# Patient Record
Sex: Male | Born: 1970 | Race: White | Hispanic: No | Marital: Married | Smoking: Former smoker
Health system: Southern US, Community
[De-identification: ages and names within clinical notes are randomized; demographics above are authoritative.]

## PROBLEM LIST (undated history)

## (undated) DIAGNOSIS — Z8719 Personal history of other diseases of the digestive system: Secondary | ICD-10-CM

## (undated) DIAGNOSIS — R51 Headache: Secondary | ICD-10-CM

## (undated) DIAGNOSIS — Z86718 Personal history of other venous thrombosis and embolism: Secondary | ICD-10-CM

## (undated) DIAGNOSIS — K219 Gastro-esophageal reflux disease without esophagitis: Secondary | ICD-10-CM

## (undated) HISTORY — PX: HERNIA REPAIR: SHX51

## (undated) HISTORY — PX: OTHER SURGICAL HISTORY: SHX169

## (undated) HISTORY — PX: APPENDECTOMY: SHX54

---

## 1997-12-31 ENCOUNTER — Ambulatory Visit (HOSPITAL_COMMUNITY): Admission: RE | Admit: 1997-12-31 | Discharge: 1997-12-31 | Payer: Self-pay | Admitting: Gastroenterology

## 1998-01-07 ENCOUNTER — Ambulatory Visit (HOSPITAL_COMMUNITY): Admission: RE | Admit: 1998-01-07 | Discharge: 1998-01-07 | Payer: Self-pay | Admitting: Gastroenterology

## 1998-02-11 ENCOUNTER — Ambulatory Visit (HOSPITAL_COMMUNITY): Admission: RE | Admit: 1998-02-11 | Discharge: 1998-02-11 | Payer: Self-pay | Admitting: Surgery

## 1998-02-20 ENCOUNTER — Other Ambulatory Visit: Admission: RE | Admit: 1998-02-20 | Discharge: 1998-02-20 | Payer: Self-pay | Admitting: Surgery

## 1998-02-21 ENCOUNTER — Inpatient Hospital Stay (HOSPITAL_COMMUNITY): Admission: RE | Admit: 1998-02-21 | Discharge: 1998-02-22 | Payer: Self-pay | Admitting: Surgery

## 1998-05-27 ENCOUNTER — Ambulatory Visit (HOSPITAL_COMMUNITY): Admission: RE | Admit: 1998-05-27 | Discharge: 1998-05-27 | Payer: Self-pay | Admitting: Surgery

## 1998-05-27 ENCOUNTER — Encounter: Payer: Self-pay | Admitting: Surgery

## 1998-11-18 ENCOUNTER — Other Ambulatory Visit: Admission: RE | Admit: 1998-11-18 | Discharge: 1998-11-18 | Payer: Self-pay | Admitting: Gastroenterology

## 2003-07-05 ENCOUNTER — Inpatient Hospital Stay (HOSPITAL_COMMUNITY): Admission: EM | Admit: 2003-07-05 | Discharge: 2003-07-07 | Payer: Self-pay | Admitting: Emergency Medicine

## 2003-07-12 ENCOUNTER — Encounter: Admission: RE | Admit: 2003-07-12 | Discharge: 2003-07-12 | Payer: Self-pay | Admitting: Internal Medicine

## 2003-07-16 ENCOUNTER — Encounter: Admission: RE | Admit: 2003-07-16 | Discharge: 2003-07-16 | Payer: Self-pay | Admitting: Internal Medicine

## 2003-07-30 ENCOUNTER — Encounter: Admission: RE | Admit: 2003-07-30 | Discharge: 2003-07-30 | Payer: Self-pay | Admitting: Internal Medicine

## 2003-08-13 ENCOUNTER — Encounter: Admission: RE | Admit: 2003-08-13 | Discharge: 2003-08-13 | Payer: Self-pay | Admitting: Internal Medicine

## 2003-08-30 ENCOUNTER — Encounter: Admission: RE | Admit: 2003-08-30 | Discharge: 2003-08-30 | Payer: Self-pay | Admitting: Internal Medicine

## 2003-09-04 ENCOUNTER — Encounter: Admission: RE | Admit: 2003-09-04 | Discharge: 2003-09-04 | Payer: Self-pay | Admitting: Internal Medicine

## 2003-09-27 ENCOUNTER — Encounter: Admission: RE | Admit: 2003-09-27 | Discharge: 2003-09-27 | Payer: Self-pay | Admitting: Internal Medicine

## 2003-10-25 ENCOUNTER — Encounter: Admission: RE | Admit: 2003-10-25 | Discharge: 2003-10-25 | Payer: Self-pay | Admitting: Internal Medicine

## 2003-11-12 ENCOUNTER — Encounter: Admission: RE | Admit: 2003-11-12 | Discharge: 2003-11-12 | Payer: Self-pay | Admitting: Internal Medicine

## 2003-11-29 ENCOUNTER — Encounter: Admission: RE | Admit: 2003-11-29 | Discharge: 2003-11-29 | Payer: Self-pay | Admitting: Internal Medicine

## 2003-12-27 ENCOUNTER — Encounter: Admission: RE | Admit: 2003-12-27 | Discharge: 2003-12-27 | Payer: Self-pay | Admitting: Internal Medicine

## 2004-01-24 ENCOUNTER — Encounter: Admission: RE | Admit: 2004-01-24 | Discharge: 2004-01-24 | Payer: Self-pay | Admitting: Internal Medicine

## 2004-03-12 ENCOUNTER — Encounter: Admission: RE | Admit: 2004-03-12 | Discharge: 2004-03-12 | Payer: Self-pay | Admitting: Internal Medicine

## 2004-05-22 ENCOUNTER — Ambulatory Visit: Payer: Self-pay | Admitting: Internal Medicine

## 2004-05-28 ENCOUNTER — Ambulatory Visit: Payer: Self-pay | Admitting: Internal Medicine

## 2004-06-04 ENCOUNTER — Ambulatory Visit: Payer: Self-pay | Admitting: Internal Medicine

## 2004-08-25 ENCOUNTER — Ambulatory Visit: Payer: Self-pay | Admitting: Internal Medicine

## 2006-03-29 ENCOUNTER — Encounter: Admission: RE | Admit: 2006-03-29 | Discharge: 2006-03-29 | Payer: Self-pay | Admitting: Family Medicine

## 2009-07-19 ENCOUNTER — Encounter: Admission: RE | Admit: 2009-07-19 | Discharge: 2009-07-19 | Payer: Self-pay | Admitting: Infectious Diseases

## 2010-11-28 NOTE — H&P (Signed)
NAME:  Jimmy Taylor, Jimmy Taylor                         ACCOUNT NO.:  [id number]   MEDICAL RECORD NO.:  [id number]                   PATIENT TYPE:  EMS   LOCATION:  ED                                   FACILITY:  [facility]   PHYSICIAN:  Corinna L. Lendell Caprice, MD             DATE OF BIRTH:  1971-04-01   DATE OF ADMISSION:  07/05/2003  DATE OF DISCHARGE:                                HISTORY & PHYSICAL   CONTINUATION:  VITAL SIGNS:  Temperature 99.2, blood pressure 134/82, pulse  118, respiratory rate 28, oxygen saturation 98%.  GENERAL:  In general, the patient is uncomfortable-appearing.  HEENT:  Normocephalic, atraumatic.  Pupils equal, round and reactive to  light.  Moist mucous membranes.  NECK:  Neck is supple.  Some shotty submandibular lymphadenopathy.  LUNGS:  He has bilateral rhonchi, no wheezes or rales.  CARDIOVASCULAR:  Regular rate and rhythm without murmurs, gallops or rubs.  ABDOMEN:  Abdomen soft, nontender and nondistended.  EXTREMITIES:  Homans sign negative.  No calf tenderness.  No clubbing or  cyanosis.  SKIN:  The patient has a macular erythematous rash along his neck, chest and  abdomen and he is diaphoretic.  NEUROLOGIC:  Alert and oriented.  Cranial nerves and sensorimotor exam are  intact.   LABORATORIES:  His white blood cell count is normal except for a platelet  count of 446,000.  D-dimer is 1.91.  Potassium is 3.4, otherwise, his  complete metabolic panel is unremarkable.   Chest x-ray negative.   CT of the chest shows a small right middle lobe pulmonary embolus.   ASSESSMENT AND PLAN:  1. Pulmonary embolus:  The patient will be admitted to telemetry.  He will     get Lovenox and Coumadin, daily INRs.  He will be on bedrest for a few     days.  2. Acute bronchitis:  Continue Biaxin and cough suppressants.  3. Drug eruption secondary to codeine:  He will get Benadryl.                                               Corinna L. Lendell Caprice, MD    CLS/MEDQ  D:   07/05/2003  T:  07/05/2003  Job:  [id number]

## 2010-11-28 NOTE — Discharge Summary (Signed)
NAME:  Jimmy Taylor, Jimmy Taylor                         ACCOUNT NO.:  [id number]   MEDICAL RECORD NO.:  [id number]                   PATIENT TYPE:  INP   LOCATION:  1610                                 FACILITY:  [facility]   PHYSICIAN:  Leonia Reeves, MD                 DATE OF BIRTH:  03/11/1971   DATE OF ADMISSION:  07/04/2003  DATE OF DISCHARGE:  07/07/2003                                 DISCHARGE SUMMARY   PRIMARY CARE PHYSICIAN:  None listed.   DISCHARGE DIAGNOSES:  1. Acute pulmonary embolism.  2. Acute bronchitis.   DISCHARGE MEDICATIONS:  1. Coumadin 5 mg once daily.  2. Levaquin 500 mg once daily for 10 days.   DISCHARGE INSTRUCTIONS:  Follow up in Coumadin Clinic, Oletta Cohn, pager [phone]  [phone], for Coumadin management.   SUMMARY:  [name] is a 40 year old white male who was seen in Urgent  Care Clinic several days prior to presentation and was diagnosed with  pneumonia and given Biaxin.  Since then patient continued to cough.  He also  had shortness of breath and fever with chills.  He presented with chest  pain, which he attributed to the cough.  He had codeine and developed rash.  He had no known risk factors for pulmonary embolism.   PHYSICAL EXAMINATION:  Blood pressure 134/82, pulse 118, respiratory rate  28, oxygen saturation 98% in room air.  Patient was well-built, well-  nourished, but in acute distress secondary to dyspnea.  LUNGS:  Showed minimal rhonchi, no wheezes, no rales.  CARDIOVASCULAR:  Heart rate was normal, no murmur, no gallop, no rub.  GIT:  Abdomen was soft and nontender, nondistended.  EXTREMITIES:  No tenderness.  Hoffman's sign was negative.  The rest of the physical was not remarkable.   LABORATORY DATA:  D-dimer was 1.91.  Potassium 3.4.   CT of chest impression:  Small embolus in the right middle lobe pulmonary  artery.  No infiltrate or focal lesion.   HOSPITAL COURSE:  The patient was admitted to the medical floor and started  on the  regimen for pulmonary embolism.  Accordingly he was started on subcu.  Lovenox and p.o. Coumadin with PT and INR monitored.  Patient was continued  on Biaxin which was later switched to p.o. Levaquin.  He was given Benadryl  for the itching and both itching and rash resolved.  He was also rehydrated  with normal saline plus potassium chloride for correction of the  electrolytes.  Patient responded to the above regimen with INR therapeutic.   DISPOSITION:  Patient is being discharged today and has been instructed to  follow up with primary care physician in seven to 14 days of discharge.  He  has also been instructed to follow up with Coumadin Clinic make an  appointment for Coumadin management.  The rationale for this discharge was  discussed with the patient and he is agreeable.  Leonia Reeves, MD    VO/MEDQ  D:  07/07/2003  T:  07/07/2003  Job:  [id number]

## 2010-11-28 NOTE — H&P (Signed)
NAME:  Jimmy Taylor, Jimmy Taylor                         ACCOUNT NO.:  [id number]   MEDICAL RECORD NO.:  [id number]                   PATIENT TYPE:  EMS   LOCATION:  ED                                   FACILITY:  [facility]   PHYSICIAN:  Corinna L. Lendell Caprice, MD             DATE OF BIRTH:  [birth year]   DATE OF ADMISSION:  07/05/2003  DATE OF DISCHARGE:                                HISTORY & PHYSICAL   CHIEF COMPLAINT:  Cough.   HISTORY OF PRESENT ILLNESS:  Mr. Pfarr is a 40 year old white male who  was seen in an urgent care clinic several days ago, diagnosed with pneumonia  and given Biaxin.  Since then, his cough has not improved; he has also had  shortness of breath and fevers and chills.  He has had some chest pain but  he attributed that to coughing so much.  He had been given codeine and has  developed a rash from that.  He has no risk factors for PE.   PAST MEDICAL HISTORY:  None.   MEDICATIONS:  Biaxin, Benadryl, albuterol MDI.   ALLERGY:  CODEINE.   SOCIAL HISTORY:  The patient does not smoke or drink.  He is married.   FAMILY HISTORY:  His sister has hypertension.   REVIEW OF SYSTEMS:  As above, otherwise, negative.   DICTATION ENDED AT THIS POINT.                                               Corinna L. Lendell Caprice, MD    CLS/MEDQ  D:  07/05/2003  T:  07/05/2003  Job:  [id number]

## 2011-12-08 ENCOUNTER — Encounter (HOSPITAL_COMMUNITY): Payer: Self-pay | Admitting: *Deleted

## 2011-12-08 ENCOUNTER — Emergency Department (HOSPITAL_COMMUNITY): Payer: Worker's Compensation

## 2011-12-08 ENCOUNTER — Emergency Department (HOSPITAL_COMMUNITY)
Admission: EM | Admit: 2011-12-08 | Discharge: 2011-12-08 | Disposition: A | Discharge status: Home / Self Care | Payer: Worker's Compensation | Attending: Emergency Medicine | Admitting: Emergency Medicine

## 2011-12-08 DIAGNOSIS — Z79899 Other long term (current) drug therapy: Secondary | ICD-10-CM | POA: Insufficient documentation

## 2011-12-08 DIAGNOSIS — S43109A Unspecified dislocation of unspecified acromioclavicular joint, initial encounter: Secondary | ICD-10-CM

## 2011-12-08 DIAGNOSIS — W208XXA Other cause of strike by thrown, projected or falling object, initial encounter: Secondary | ICD-10-CM | POA: Insufficient documentation

## 2011-12-08 DIAGNOSIS — K219 Gastro-esophageal reflux disease without esophagitis: Secondary | ICD-10-CM | POA: Insufficient documentation

## 2011-12-08 DIAGNOSIS — S42033A Displaced fracture of lateral end of unspecified clavicle, initial encounter for closed fracture: Secondary | ICD-10-CM | POA: Insufficient documentation

## 2011-12-08 DIAGNOSIS — S42009A Fracture of unspecified part of unspecified clavicle, initial encounter for closed fracture: Secondary | ICD-10-CM

## 2011-12-08 DIAGNOSIS — Y9289 Other specified places as the place of occurrence of the external cause: Secondary | ICD-10-CM | POA: Insufficient documentation

## 2011-12-08 HISTORY — DX: Gastro-esophageal reflux disease without esophagitis: K21.9

## 2011-12-08 MED ORDER — METOCLOPRAMIDE HCL 10 MG PO TABS: 10.0000 mg | ORAL_TABLET | Freq: Four times a day (QID) | ORAL | Status: DC | PRN

## 2011-12-08 MED ORDER — ONDANSETRON 4 MG PO TBDP
8.0000 mg | ORAL_TABLET | Freq: Once | ORAL | Status: AC
  Administered 2011-12-08: 8 mg via ORAL

## 2011-12-08 MED ORDER — ONDANSETRON 8 MG PO TBDP: ORAL_TABLET | ORAL | Status: AC

## 2011-12-08 MED ORDER — HYDROMORPHONE HCL PF 2 MG/ML IJ SOLN
2.0000 mg | Freq: Once | INTRAMUSCULAR | Status: AC
  Administered 2011-12-08: 2 mg via INTRAMUSCULAR
  Filled 2011-12-08: qty 1

## 2011-12-08 MED ORDER — ONDANSETRON 4 MG PO TBDP
ORAL_TABLET | ORAL | Status: AC
  Filled 2011-12-08: qty 2

## 2011-12-08 MED ORDER — MEPERIDINE HCL 50 MG PO TABS: 50.0000 mg | ORAL_TABLET | ORAL | Status: AC | PRN

## 2011-12-08 NOTE — ED Notes (Signed)
Jimmy Taylor, [phone], with employeer please call when the pt has results of X ray

## 2011-12-08 NOTE — Discharge Instructions (Signed)
Clavicle Fracture A clavicle fracture is a break in the collarbone. This is a common injury, especially in children. Collarbones do not harden until around the age of 50. Most collarbone fractures are treated with a simple arm sling. In some cases a figure-of-eight splint is used to help hold the broken bones in position. Although not often needed, surgery may be required if the bone fragments are not in the correct position (displaced).  HOME CARE INSTRUCTIONS   Apply ice to the injury for 15 to 20 minutes each hour while awake for 2 days. Put the ice in a plastic bag and place a towel between the bag of ice and your skin.   Wear the sling or splint constantly for as long as directed by your caregiver. You may remove the sling or splint for bathing or showering. Be sure to keep your shoulder in the same place as when the sling or splint is on. Do not lift your arm.   If a figure-of-eight splint is applied, it must be tightened by another person every day. Tighten it enough to keep the shoulders held back. Allow enough room to place the index finger between the body and strap. Loosen the splint immediately if you feel numbness or tingling in your hands.   Only take over-the-counter or prescription medicines for pain, discomfort, or fever as directed by your caregiver.   Avoid activities that irritate or increase the pain for 4 to 6 weeks after surgery.   Follow all instructions for follow-up with your caregiver. This includes any referrals, physical therapy, and rehabilitation. Any delay in obtaining necessary care could result in a delay or failure of the injury to heal properly.  SEEK MEDICAL CARE IF:  You have pain and swelling that are not relieved with medications. SEEK IMMEDIATE MEDICAL CARE IF:  Your arm is numb, cold, or pale, even when the splint is loose. MAKE SURE YOU:   Understand these instructions.   Will watch your condition.   Will get help right away if you are not doing  well or get worse.  Document Released: 04/08/2005 Document Revised: 06/18/2011 Document Reviewed: 02/02/2008 [facility] Patient Information 2012 [place], [place].Acromioclavicular Injuries The AC (acromioclavicular) joint is the joint in the shoulder where the collarbone (clavicle) meets the shoulder blade (scapula). The part of the shoulder blade connected to the collarbone is called the acromion. Common problems with and treatments for the [facility] joint are detailed below. ARTHRITIS Arthritis occurs when the joint has been injured and the smooth padding between the joints (cartilage) is lost. This is the wear and tear seen in most joints of the body if they have been overused. This causes the joint to produce pain and swelling which is worse with activity.  AC JOINT SEPARATION AC joint separation means that the ligaments connecting the acromion of the shoulder blade and collarbone have been damaged, and the two bones no longer line up. AC separations can be anywhere from mild to severe, and are "graded" depending upon which ligaments are torn and how badly they are torn.  Grade I Injury: the least damage is done, and the [facility] joint still lines up.   Grade II Injury: damage to the ligaments which reinforce the [facility] joint. In a Grade II injury, these ligaments are stretched but not entirely torn. When stressed, the [facility] joint becomes painful and unstable.   Grade III Injury: AC and secondary ligaments are completely torn, and the collarbone is no longer attached to the shoulder blade.  This results in deformity; a prominence of the end of the clavicle.  AC JOINT FRACTURE AC joint fracture means that there has been a break in the bones of the [facility] joint, usually the end of the clavicle. TREATMENT TREATMENT OF AC ARTHRITIS  There is currently no way to replace the cartilage damaged by arthritis. The best way to improve the condition is to decrease the activities which aggravate the problem. Application of ice to the  joint helps decrease pain and soreness (inflammation). The use of non-steroidal anti-inflammatory medication is helpful.   If less conservative measures do not work, then cortisone shots (injections) may be used. These are anti-inflammatories; they decrease the soreness in the joint and swelling.   If non-surgical measures fail, surgery may be recommended. The procedure is generally removal of a portion of the end of the clavicle. This is the part of the collarbone closest to your acromion which is stabilized with ligaments to the acromion of the shoulder blade. This surgery may be performed using a tube-like instrument with a light (arthroscope) for looking into a joint. It may also be performed as an open surgery through a small incision by the surgeon. Most patients will have good range of motion within 6 weeks and may return to all activity including sports by 8-12 weeks, barring complications.  TREATMENT OF AN AC SEPARATION  The initial treatment is to decrease pain. This is best accomplished by immobilizing the arm in a sling and placing an ice pack to the shoulder for 20 to 30 minutes every 2 hours as needed. As the pain starts to subside, it is important to begin moving the fingers, wrist, elbow and eventually the shoulder in order to prevent a stiff or "frozen" shoulder. Instruction on when and how much to move the shoulder will be provided by your caregiver. The length of time needed to regain full motion and function depends on the amount or grade of the injury. Recovery from a Grade I AC separation usually takes 10 to 14 days, whereas a Grade III may take 6 to 8 weeks.   Grade I and II separations usually do not require surgery. Even Grade III injuries usually allow return to full activity with few restrictions. Treatment is also based on the activity demands of the injured shoulder. For example, a high level quarterback with an injured throwing arm will receive more aggressive treatment than  someone with a desk job who rarely uses his/her arm for strenuous activities. In some cases, a painful lump may persist which could require a later surgery. Surgery can be very successful, but the benefits must be weighed against the potential risks.  TREATMENT OF AN AC JOINT FRACTURE Fracture treatment depends on the type of fracture. Sometimes a splint or sling may be all that is required. Other times surgery may be required for repair. This is more frequently the case when the ligaments supporting the clavicle are completely torn. Your caregiver will help you with these decisions and together you can decide what will be the best treatment. HOME CARE INSTRUCTIONS   Apply ice to the injury for 15 to 20 minutes each hour while awake for 2 days. Put the ice in a plastic bag and place a towel between the bag of ice and skin.   If a sling has been applied, wear it constantly for as long as directed by your caregiver, even at night. The sling or splint can be removed for bathing or showering or as directed. Be  sure to keep the shoulder in the same place as when the sling is on. Do not lift the arm.   If a figure-of-eight splint has been applied it should be tightened gently by another person every day. Tighten it enough to keep the shoulders held back. Allow enough room to place the index finger between the body and strap. Loosen the splint immediately if there is numbness or tingling in the hands.   Take over-the-counter or prescription medicines for pain, discomfort or fever as directed by your caregiver.   If you or your child has received a follow up appointment, it is very important to keep that appointment in order to avoid long term complications, chronic pain or disability.  SEEK MEDICAL CARE IF:   The pain is not relieved with medications.   There is increased swelling or discoloration that continues to get worse rather than better.   You or your child has been unable to follow up as  instructed.   There is progressive numbness and tingling in the arm, forearm or hand.  SEEK IMMEDIATE MEDICAL CARE IF:   The arm is numb, cold or pale.   There is increasing pain in the hand, forearm or fingers.  MAKE SURE YOU:   Understand these instructions.   Will watch your condition.   Will get help right away if you are not doing well or get worse.  Document Released: 04/08/2005 Document Revised: 06/18/2011 Document Reviewed: 10/01/2008 [facility] Patient Information 2012 [place], [place].  Call or return immediately if you have: Increased pain or pressure around the injury.  Numbness, tingling, painful, cool toes or fingers.  Swelling or inability to move fingers or toes.  Color change to fingers or toes (blue or gray).   Wear sling for comfort.

## 2011-12-08 NOTE — ED Notes (Signed)
Pt shirt and boots removed and placed in a gown. Call button placed at bed side.

## 2011-12-08 NOTE — ED Notes (Signed)
Pt is here right shoulder/collar bone injury and left elbow swelling.  Pt had [phone] pound piece of freight and hit him in collar.  Pt has redness and bruising to right side.  No neck swelling

## 2011-12-08 NOTE — ED Notes (Signed)
Assisted pt in undressing with his shirt and applying a gown on; pt came in with a shoulder immobilizer on; co-worker at bedside

## 2011-12-08 NOTE — ED Provider Notes (Signed)
History     CSN: [id number]  Arrival date & time 12/08/11  1028   First MD Initiated Contact with Patient 12/08/11 1059      Chief Complaint  Patient presents with  . Shoulder Injury    right    (Consider location/radiation/quality/duration/timing/severity/associated sxs/prior treatment) HPI This 41 year old male was at work this morning when a large heavy object fell hitting him on the right shoulder causing him to fall. He is pain with abrasions to the right shoulder and clavicle he also has some pain with abrasion and bruising to his left elbow over the olecranon process. He has full range of motion to his left elbow without weakness or numbness distally. He is decreased range of motion to the right shoulder due to pain there is no distal weakness or numbness. There is no head or neck injury. He has no amnesia. There is no back pain chest pain shortness breath abdominal pain. He has no injury to his legs. His tetanus shot is up-to-date 3 weeks ago. Past Medical History  Diagnosis Date  . GERD (gastroesophageal reflux disease)     Past Surgical History  Procedure Date  . Appendectomy   . Hernia repair     History reviewed. No pertinent family history.  History  Substance Use Topics  . Smoking status: Former Games developer  . Smokeless tobacco: Not on file  . Alcohol Use: No      Review of Systems  Constitutional: Negative for fever.       10 Systems reviewed and are negative for acute change except as noted in the HPI.  HENT: Negative for congestion.   Eyes: Negative for discharge and redness.  Respiratory: Negative for cough and shortness of breath.   Cardiovascular: Negative for chest pain.  Gastrointestinal: Negative for vomiting and abdominal pain.  Musculoskeletal: Negative for back pain.  Skin: Positive for wound. Negative for rash.  Neurological: Negative for syncope, numbness and headaches.  Psychiatric/Behavioral:       No behavior change.    Allergies    Codeine and Hydrocodone  Home Medications   Current Outpatient Rx  Name Route Sig Dispense Refill  . IBUPROFEN 200 MG PO TABS Oral Take 200 mg by mouth every 6 (six) hours as needed. For pain    . KETOROLAC TROMETHAMINE IJ Injection Inject as directed once. Given at dr office [facility]    . LANSOPRAZOLE 15 MG PO CPDR Oral Take 15 mg by mouth daily as needed.     Marland Kitchen MEPERIDINE HCL 50 MG PO TABS Oral Take 1 tablet (50 mg total) by mouth every 4 (four) hours as needed for pain. 30 tablet 0  . METOCLOPRAMIDE HCL 10 MG PO TABS Oral Take 1 tablet (10 mg total) by mouth every 6 (six) hours as needed (nausea/headache). 6 tablet 0  . ONDANSETRON 8 MG PO TBDP  8mg  ODT q4 hours prn nausea 4 tablet 0    BP 120/68  Pulse 72  Temp(Src) 97.6 F (36.4 C) (Oral)  Resp 17  SpO2 96%  Physical Exam  Nursing note and vitals reviewed. Constitutional:       Awake, alert, nontoxic appearance.  HENT:  Head: Atraumatic.  Eyes: Right eye exhibits no discharge. Left eye exhibits no discharge.  Neck: Neck supple.  Cardiovascular: Normal rate and regular rhythm.   No murmur heard. Pulmonary/Chest: Effort normal and breath sounds normal. No respiratory distress. He has no wheezes. He has no rales. He exhibits no tenderness.  Abdominal: Soft.  There is no tenderness. There is no rebound.  Musculoskeletal: He exhibits no edema and no tenderness.       Baseline ROM, no obvious new focal weakness. Cervical spine and back are nontender both legs are nontender his left arm is nontender at the hand wrist and shoulder he does have tenderness ecchymosis and abrasion over the olecranon process of his left elbow with full active range of motion of the elbow with full extension full flexion and no tenderness of the radial head or medial or lateral epicondyles. His distal sensation and motor strength intact in the distributions of the median radial and ulnar nerve function in his left arm with capillary refill less than  2 seconds to the left hand. His right arm is tender diffusely in the shoulder with abrasions and tender over the clavicle with no tenderness over the proximal humerus elbow forearm wrist or hand. His right arm has capillary refill less than 2 seconds as well as normal light touch in the distributions of the deltoid as well as median radial and ulnar nerves with motor strength intact in those distributions as well except I did not test the deltoid muscle strength to his shoulder pain with decreased range of motion due to pain at the shoulder.  Neurological: He is alert.       Mental status and motor strength appears baseline for patient and situation.  Skin: No rash noted.  Psychiatric: He has a normal mood and affect.    ED Course  Procedures (including critical care time)  Labs Reviewed - No data to display Dg Clavicle Right  12/08/2011  *RADIOLOGY REPORT*  Clinical Data: Pain after trauma  RIGHT CLAVICLE - 2+ VIEWS  Comparison: None.  Findings: There is a transverse fracture through the mid - distal right clavicle with approximately one half shaft width inferior displacement of the distal fracture fragment.  The [facility] joint is widened, as well, measuring approximately 7 mm.  IMPRESSION: Transverse, mildly comminuted fracture through the mid - distal right clavicle.  AC joint widening.  Original Report Authenticated By: Brandon Melnick, M.D.   Dg Shoulder Right  12/08/2011  *RADIOLOGY REPORT*  Clinical Data: Pain after trauma  RIGHT SHOULDER - 2+ VIEW  Comparison: None.  Findings: There is a transverse fracture at the mid - distal right clavicle.  The [facility] joint is widened, measuring 7 mm.  The subacromial space is maintained.  The humerus and scapula have a normal appearance.  IMPRESSION: Right clavicular fracture.  AC joint widening.  Original Report Authenticated By: Brandon Melnick, M.D.   Dg Elbow Complete Left  12/08/2011  *RADIOLOGY REPORT*  Clinical Data: Pain after trauma  LEFT ELBOW - COMPLETE  3+ VIEW  Comparison: None.  Findings: There is no evidence of bone, joint, or soft tissue abnormality.  IMPRESSION: Normal left elbow.  Original Report Authenticated By: Brandon Melnick, M.D.     1. Clavicle fracture   2. AC separation       MDM   Pt stable in ED with no significant deterioration in condition.Patient / Family / Caregiver informed of clinical course, understand medical decision-making process, and agree with plan.I doubt any other EMC precluding discharge at this time including, but not necessarily limited to the following:TBI, CSI.      Hurman Horn, MD 12/09/11 [phone]

## 2011-12-08 NOTE — ED Notes (Signed)
Phlebotomists notified that pt will be filing workman's comp

## 2011-12-30 ENCOUNTER — Encounter (HOSPITAL_COMMUNITY): Payer: Self-pay | Admitting: Pharmacy Technician

## 2011-12-31 ENCOUNTER — Encounter (HOSPITAL_COMMUNITY)
Admission: RE | Admit: 2011-12-31 | Discharge: 2011-12-31 | Disposition: A | Discharge status: Home / Self Care | Payer: Worker's Compensation | Source: Ambulatory Visit | Attending: Orthopedic Surgery | Admitting: Orthopedic Surgery

## 2011-12-31 ENCOUNTER — Encounter (HOSPITAL_COMMUNITY): Payer: Self-pay

## 2011-12-31 HISTORY — DX: Personal history of other diseases of the digestive system: Z87.19

## 2011-12-31 LAB — CBC
Hemoglobin: 15.9 g/dL (ref 13.0–17.0)
MCV: 92.7 fL (ref 78.0–100.0)
Platelets: 317 10*3/uL (ref 150–400)
RBC: 4.79 MIL/uL (ref 4.22–5.81)
WBC: 8 10*3/uL (ref 4.0–10.5)

## 2011-12-31 MED ORDER — CHLORHEXIDINE GLUCONATE 4 % EX LIQD: 60.0000 mL | Freq: Once | CUTANEOUS | Status: DC

## 2011-12-31 NOTE — H&P (Signed)
CC: right shoulder pain s/p injury HPI: 40 y/o male injured right shoulder when hydraulic pump fell on right shoulder causing a clavicle fracture 3 weeks ago Pt c/o continued and worsening pain to right shoulder and has elected to have clavicle orif to decrease pain and increase function PMH: GERD Family: noncontributory Social: married, non smoker, no ETOH ROS: pain with rom right shoulder otherwise negative  PE: alert and appropriate 40 y/o male in no acute distress Right shoulder: obvious clavicle fracture with displacement Minimal ecchymosis nv intact distally Strength of ER and IR 5/5 X-rays: right midshaft clavicle fracture with comminution Assessment: right clavicle fracture Plan: will plan on a CT scan to determine whether a plate or pin would work better for Jimmy Taylor and plan for ORIF 

## 2011-12-31 NOTE — Pre-Procedure Instructions (Signed)
18 Jimmy Taylor  12/31/2011   Your procedure is scheduled on:  January 04, 2012 at 1145 AM  Report to [facility] at 0945 AM.  Call this number if you have problems the morning of surgery: [phone]   Remember:   Do not eat food or drink:After Midnight.    .  Take these medicines the morning of surgery with A SIP OF WATER: Tylenol, and Prevacid   Do not wear jewelry,.  Do not wear lotions,or powders, . You may wear deodorant.  Do not shave 48 hours prior to surgery. Men may shave face and neck.  Do not bring valuables to the hospital.  Contacts, dentures or bridgework may not be worn into surgery.  Leave suitcase in the car. After surgery it may be brought to your room.  For patients admitted to the hospital, checkout time is 11:00 AM the day of discharge.   Patients discharged the day of surgery will not be allowed to drive home.  Name and phone number of your driver: Joy  Special Instructions: Incentive Spirometry - Practice and bring it with you on the day of surgery. and CHG Shower Use Special Wash: 1/2 bottle night before surgery and 1/2 bottle morning of surgery.   Please read over the following fact sheets that you were given: Pain Booklet, Coughing and Deep Breathing, MRSA Information and Surgical Site Infection Prevention

## 2012-01-03 MED ORDER — CEFAZOLIN SODIUM-DEXTROSE 2-3 GM-% IV SOLR: 2.0000 g | INTRAVENOUS | Status: DC

## 2012-01-03 MED ORDER — SODIUM CHLORIDE 0.9 % IV SOLN: INTRAVENOUS | Status: DC

## 2012-01-04 ENCOUNTER — Ambulatory Visit (HOSPITAL_COMMUNITY)
Admission: RE | Admit: 2012-01-04 | Payer: Worker's Compensation | Source: Ambulatory Visit | Admitting: Orthopedic Surgery

## 2012-01-04 ENCOUNTER — Encounter (HOSPITAL_COMMUNITY): Admission: RE | Payer: Self-pay | Source: Ambulatory Visit

## 2012-01-04 SURGERY — OPEN REDUCTION INTERNAL FIXATION (ORIF) CLAVICULAR FRACTURE
Anesthesia: Choice | Laterality: Right

## 2012-01-06 ENCOUNTER — Encounter (HOSPITAL_COMMUNITY): Payer: Self-pay | Admitting: *Deleted

## 2012-01-06 NOTE — Progress Notes (Signed)
Dr Ranell Patrick office called to sign orders, message lft for karen at 1500 on [phone]

## 2012-01-07 ENCOUNTER — Encounter (HOSPITAL_COMMUNITY)
Admission: RE | Disposition: A | Discharge status: Home / Self Care | Payer: Self-pay | Source: Ambulatory Visit | Attending: Orthopedic Surgery

## 2012-01-07 ENCOUNTER — Encounter (HOSPITAL_COMMUNITY): Payer: Self-pay | Admitting: Anesthesiology

## 2012-01-07 ENCOUNTER — Ambulatory Visit (HOSPITAL_COMMUNITY): Payer: Worker's Compensation

## 2012-01-07 ENCOUNTER — Encounter (HOSPITAL_COMMUNITY): Payer: Self-pay | Admitting: *Deleted

## 2012-01-07 ENCOUNTER — Ambulatory Visit (HOSPITAL_COMMUNITY): Payer: Worker's Compensation | Admitting: Anesthesiology

## 2012-01-07 ENCOUNTER — Ambulatory Visit (HOSPITAL_COMMUNITY)
Admission: RE | Admit: 2012-01-07 | Discharge: 2012-01-07 | Disposition: A | Discharge status: Home / Self Care | Payer: Worker's Compensation | Source: Ambulatory Visit | Attending: Orthopedic Surgery | Admitting: Orthopedic Surgery

## 2012-01-07 DIAGNOSIS — Z01812 Encounter for preprocedural laboratory examination: Secondary | ICD-10-CM | POA: Insufficient documentation

## 2012-01-07 DIAGNOSIS — Y990 Civilian activity done for income or pay: Secondary | ICD-10-CM | POA: Insufficient documentation

## 2012-01-07 DIAGNOSIS — W208XXA Other cause of strike by thrown, projected or falling object, initial encounter: Secondary | ICD-10-CM | POA: Insufficient documentation

## 2012-01-07 DIAGNOSIS — Y9289 Other specified places as the place of occurrence of the external cause: Secondary | ICD-10-CM | POA: Insufficient documentation

## 2012-01-07 DIAGNOSIS — S42023A Displaced fracture of shaft of unspecified clavicle, initial encounter for closed fracture: Secondary | ICD-10-CM | POA: Insufficient documentation

## 2012-01-07 HISTORY — PX: ORIF CLAVICULAR FRACTURE: SHX5055

## 2012-01-07 SURGERY — OPEN REDUCTION INTERNAL FIXATION (ORIF) CLAVICULAR FRACTURE
Anesthesia: General | Site: Shoulder | Laterality: Right | Wound class: Clean

## 2012-01-07 MED ORDER — LIDOCAINE HCL (CARDIAC) 20 MG/ML IV SOLN
INTRAVENOUS | Status: DC | PRN
  Administered 2012-01-07: 70 mg via INTRAVENOUS

## 2012-01-07 MED ORDER — ONDANSETRON HCL 4 MG/2ML IJ SOLN: 4.0000 mg | Freq: Four times a day (QID) | INTRAMUSCULAR | Status: DC | PRN

## 2012-01-07 MED ORDER — ROCURONIUM BROMIDE 100 MG/10ML IV SOLN
INTRAVENOUS | Status: DC | PRN
  Administered 2012-01-07: 50 mg via INTRAVENOUS

## 2012-01-07 MED ORDER — GLYCOPYRROLATE 0.2 MG/ML IJ SOLN
INTRAMUSCULAR | Status: DC | PRN
  Administered 2012-01-07: .8 mg via INTRAVENOUS

## 2012-01-07 MED ORDER — ONDANSETRON HCL 4 MG/2ML IJ SOLN
INTRAMUSCULAR | Status: DC | PRN
  Administered 2012-01-07: 4 mg via INTRAVENOUS

## 2012-01-07 MED ORDER — CEFAZOLIN SODIUM 1-5 GM-% IV SOLN
INTRAVENOUS | Status: AC
  Filled 2012-01-07: qty 100

## 2012-01-07 MED ORDER — PROMETHAZINE HCL 25 MG/ML IJ SOLN
INTRAMUSCULAR | Status: AC
  Filled 2012-01-07: qty 1

## 2012-01-07 MED ORDER — MIDAZOLAM HCL 5 MG/5ML IJ SOLN
INTRAMUSCULAR | Status: DC | PRN
  Administered 2012-01-07: 2 mg via INTRAVENOUS

## 2012-01-07 MED ORDER — CEFAZOLIN SODIUM 1-5 GM-% IV SOLN
INTRAVENOUS | Status: DC | PRN
  Administered 2012-01-07: 2 g via INTRAVENOUS

## 2012-01-07 MED ORDER — HYDROMORPHONE HCL PF 1 MG/ML IJ SOLN
INTRAMUSCULAR | Status: AC
  Filled 2012-01-07: qty 1

## 2012-01-07 MED ORDER — HYDROMORPHONE HCL PF 1 MG/ML IJ SOLN
0.2500 mg | INTRAMUSCULAR | Status: DC | PRN
  Administered 2012-01-07: 0.5 mg via INTRAVENOUS

## 2012-01-07 MED ORDER — OXYCODONE-ACETAMINOPHEN 5-325 MG PO TABS: 1.0000 | ORAL_TABLET | ORAL | Status: DC | PRN

## 2012-01-07 MED ORDER — NEOSTIGMINE METHYLSULFATE 1 MG/ML IJ SOLN
INTRAMUSCULAR | Status: DC | PRN
  Administered 2012-01-07: 5 mg via INTRAVENOUS

## 2012-01-07 MED ORDER — OXYCODONE-ACETAMINOPHEN 5-325 MG PO TABS
ORAL_TABLET | ORAL | Status: AC
  Administered 2012-01-07: 1
  Filled 2012-01-07: qty 1

## 2012-01-07 MED ORDER — PROPOFOL 10 MG/ML IV EMUL
INTRAVENOUS | Status: DC | PRN
  Administered 2012-01-07: 200 mg via INTRAVENOUS

## 2012-01-07 MED ORDER — LACTATED RINGERS IV SOLN
INTRAVENOUS | Status: DC
  Administered 2012-01-07 (×2): via INTRAVENOUS

## 2012-01-07 MED ORDER — ACETAMINOPHEN 10 MG/ML IV SOLN
INTRAVENOUS | Status: AC
  Filled 2012-01-07: qty 100

## 2012-01-07 MED ORDER — PROMETHAZINE HCL 25 MG/ML IJ SOLN
12.5000 mg | Freq: Once | INTRAMUSCULAR | Status: AC
  Administered 2012-01-07: 6.25 mg via INTRAVENOUS

## 2012-01-07 MED ORDER — BUPIVACAINE-EPINEPHRINE 0.25% -1:200000 IJ SOLN
INTRAMUSCULAR | Status: DC | PRN
  Administered 2012-01-07: 7 mL

## 2012-01-07 MED ORDER — LACTATED RINGERS IV SOLN
INTRAVENOUS | Status: DC | PRN
  Administered 2012-01-07: 16:00:00 via INTRAVENOUS

## 2012-01-07 MED ORDER — DEXAMETHASONE SODIUM PHOSPHATE 4 MG/ML IJ SOLN
INTRAMUSCULAR | Status: DC | PRN
  Administered 2012-01-07: 8 mg via INTRAVENOUS

## 2012-01-07 MED ORDER — ACETAMINOPHEN 10 MG/ML IV SOLN
INTRAVENOUS | Status: DC | PRN
  Administered 2012-01-07: 1000 mg via INTRAVENOUS

## 2012-01-07 MED ORDER — 0.9 % SODIUM CHLORIDE (POUR BTL) OPTIME
TOPICAL | Status: DC | PRN
  Administered 2012-01-07: 1000 mL

## 2012-01-07 MED ORDER — BUPIVACAINE-EPINEPHRINE PF 0.25-1:200000 % IJ SOLN
INTRAMUSCULAR | Status: AC
  Filled 2012-01-07: qty 30

## 2012-01-07 MED ORDER — FENTANYL CITRATE 0.05 MG/ML IJ SOLN
INTRAMUSCULAR | Status: DC | PRN
  Administered 2012-01-07: 50 ug via INTRAVENOUS
  Administered 2012-01-07: 100 ug via INTRAVENOUS
  Administered 2012-01-07: 50 ug via INTRAVENOUS

## 2012-01-07 SURGICAL SUPPLY — 46 items
BIT DRILL 3.2 STERILE (BIT) ×1
BIT DRILL 3.2MM STRL (BIT) IMPLANT
BIT DRILL Q COUPLING 4.5 (BIT) IMPLANT
BIT DRILL Q/COUPLING 1 (BIT) IMPLANT
CLEANER TIP ELECTROSURG 2X2 (MISCELLANEOUS) IMPLANT
CLOTH BEACON ORANGE TIMEOUT ST (SAFETY) ×2 IMPLANT
DRAPE C-ARM 42X72 X-RAY (DRAPES) ×2 IMPLANT
DRAPE INCISE IOBAN 66X45 STRL (DRAPES) ×2 IMPLANT
DRAPE U-SHAPE 47X51 STRL (DRAPES) ×2 IMPLANT
DRILL BIT 3.2MM STERILE (BIT) ×2
DRSG EMULSION OIL 3X3 NADH (GAUZE/BANDAGES/DRESSINGS) ×2 IMPLANT
ELECT NDL TIP 2.8 STRL (NEEDLE) ×1 IMPLANT
ELECT NEEDLE TIP 2.8 STRL (NEEDLE) ×2 IMPLANT
ELECT REM PT RETURN 9FT ADLT (ELECTROSURGICAL) ×2
ELECTRODE REM PT RTRN 9FT ADLT (ELECTROSURGICAL) ×1 IMPLANT
GLOVE BIOGEL PI ORTHO PRO 7.5 (GLOVE) ×1
GLOVE BIOGEL PI ORTHO PRO SZ8 (GLOVE) ×1
GLOVE ORTHO TXT STRL SZ7.5 (GLOVE) ×2 IMPLANT
GLOVE PI ORTHO PRO STRL 7.5 (GLOVE) ×1 IMPLANT
GLOVE PI ORTHO PRO STRL SZ8 (GLOVE) ×1 IMPLANT
GLOVE SURG ORTHO 8.5 STRL (GLOVE) ×2 IMPLANT
GOWN STRL NON-REIN LRG LVL3 (GOWN DISPOSABLE) ×4 IMPLANT
GOWN STRL REIN XL XLG (GOWN DISPOSABLE) ×4 IMPLANT
KIT BASIN OR (CUSTOM PROCEDURE TRAY) ×2 IMPLANT
KIT ROOM TURNOVER OR (KITS) ×2 IMPLANT
MANIFOLD NEPTUNE II (INSTRUMENTS) ×2 IMPLANT
NEEDLE 22X1 1/2 (OR ONLY) (NEEDLE) IMPLANT
NEEDLE HYPO 25GX1X1/2 BEV (NEEDLE) ×2 IMPLANT
NS IRRIG 1000ML POUR BTL (IV SOLUTION) ×2 IMPLANT
PACK SHOULDER (CUSTOM PROCEDURE TRAY) ×2 IMPLANT
PAD ARMBOARD 7.5X6 YLW CONV (MISCELLANEOUS) ×4 IMPLANT
PIN CLAVICLE ASSEMBLY 3.0MM (Pin) ×1 IMPLANT
SLING ARM FOAM STRAP LRG (SOFTGOODS) ×2 IMPLANT
SPONGE GAUZE 4X4 12PLY (GAUZE/BANDAGES/DRESSINGS) ×2 IMPLANT
SPONGE LAP 4X18 X RAY DECT (DISPOSABLE) ×2 IMPLANT
STRIP CLOSURE SKIN 1/2X4 (GAUZE/BANDAGES/DRESSINGS) ×4 IMPLANT
SUCTION FRAZIER TIP 10 FR DISP (SUCTIONS) ×2 IMPLANT
SUT ETHILON 2 0 FS 18 (SUTURE) ×2 IMPLANT
SUT MNCRL AB 4-0 PS2 18 (SUTURE) ×2 IMPLANT
SUT VIC AB 2-0 CT1 27 (SUTURE) ×2
SUT VIC AB 2-0 CT1 TAPERPNT 27 (SUTURE) ×1 IMPLANT
SUT VICRYL 0 CT 1 36IN (SUTURE) ×2 IMPLANT
SYR CONTROL 10ML LL (SYRINGE) ×2 IMPLANT
TOWEL OR 17X24 6PK STRL BLUE (TOWEL DISPOSABLE) ×2 IMPLANT
TOWEL OR 17X26 10 PK STRL BLUE (TOWEL DISPOSABLE) ×2 IMPLANT
WATER STERILE IRR 1000ML POUR (IV SOLUTION) ×2 IMPLANT

## 2012-01-07 NOTE — Anesthesia Postprocedure Evaluation (Signed)
  Anesthesia Post-op Note  Patient: Jimmy Taylor  Procedure(s) Performed: Procedure(s) (LRB): OPEN REDUCTION INTERNAL FIXATION (ORIF) CLAVICULAR FRACTURE (Right)  Patient Location: PACU  Anesthesia Type: General  Level of Consciousness: awake  Airway and Oxygen Therapy: Patient Spontanous Breathing  Post-op Pain: mild  Post-op Assessment: Post-op Vital signs reviewed  Post-op Vital Signs: Reviewed  Complications: No apparent anesthesia complications
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
## 2012-01-07 NOTE — Progress Notes (Signed)
Pt OOB to BR, became nauseated again.  Was ready to go home. Wife here and updated. Pt ret to bed and Dr Ivin Booty notified.  New order for IV Phenergan.  Med given---will cont to monitor.

## 2012-01-07 NOTE — Addendum Note (Signed)
Addendum  created 01/07/12 1913 by Kerby Nora, MD   Modules edited:Orders

## 2012-01-07 NOTE — Anesthesia Preprocedure Evaluation (Addendum)
Anesthesia Evaluation  Patient identified by MRN, date of birth, ID band Patient awake    Reviewed: Allergy & Precautions, H&P , NPO status , Patient's Chart, lab work & pertinent test results  History of Anesthesia Complications Negative for: history of anesthetic complications  Airway Mallampati: II TM Distance: >3 FB Neck ROM: full    Dental  (+) Dental Advisory Given and Teeth Intact   Pulmonary former smoker         Cardiovascular negative cardio ROS      Neuro/Psych negative neurological ROS  negative psych ROS   GI/Hepatic negative GI ROS, Neg liver ROS, hiatal hernia, GERD-  Medicated and Controlled,  Endo/Other  negative endocrine ROS  Renal/GU negative Renal ROS     Musculoskeletal negative musculoskeletal ROS (+)   Abdominal   Peds  Hematology negative hematology ROS (+)   Anesthesia Other Findings   Reproductive/Obstetrics                         Anesthesia Physical Anesthesia Plan  ASA: II  Anesthesia Plan: General   Post-op Pain Management:    Induction: Intravenous  Airway Management Planned: Oral ETT  Additional Equipment:   Intra-op Plan:   Post-operative Plan: Extubation in OR  Informed Consent: I have reviewed the patients History and Physical, chart, labs and discussed the procedure including the risks, benefits and alternatives for the proposed anesthesia with the patient or authorized representative who has indicated his/her understanding and acceptance.     Plan Discussed with: CRNA and Surgeon  Anesthesia Plan Comments:         Anesthesia Quick Evaluation

## 2012-01-07 NOTE — Brief Op Note (Signed)
01/07/2012  5:25 PM  PATIENT:  Jimmy Taylor  41 y.o. male  PRE-OPERATIVE DIAGNOSIS:  right clavical fracture   POST-OPERATIVE DIAGNOSIS:  right clavical fracture   PROCEDURE:  Procedure(s) (LRB): OPEN REDUCTION INTERNAL FIXATION (ORIF) CLAVICULAR FRACTURE (Right)  SURGEON:  Surgeon(s) and Role:    * Verlee Rossetti, MD - Primary  PHYSICIAN ASSISTANT:   ASSISTANTS: Standley Dakins PA-C   ANESTHESIA:   general  EBL:   minimal  BLOOD ADMINISTERED:none  DRAINS: none   LOCAL MEDICATIONS USED:  MARCAINE     SPECIMEN:  No Specimen  DISPOSITION OF SPECIMEN:  N/A  COUNTS:  YES  TOURNIQUET:  * No tourniquets in log *  DICTATION: .Note written in EPIC  PLAN OF CARE: Discharge to home after PACU  PATIENT DISPOSITION:  PACU - hemodynamically stable.   Delay start of Pharmacological VTE agent (>24hrs) due to surgical blood loss or risk of bleeding: not applicable

## 2012-01-07 NOTE — Preoperative (Signed)
Beta Blockers   Reason not to administer Beta Blockers:Not Applicable 

## 2012-01-07 NOTE — Interval H&P Note (Signed)
History and Physical Interval Note:  01/07/2012 3:24 PM  Jimmy Taylor  has presented today for surgery, with the diagnosis of right clavical fracture   The various methods of treatment have been discussed with the patient and family. After consideration of risks, benefits and other options for treatment, the patient has consented to  Procedure(s) (LRB): OPEN REDUCTION INTERNAL FIXATION (ORIF) CLAVICULAR FRACTURE (Right) as a surgical intervention .  The patient's history has been reviewed, patient examined, no change in status, stable for surgery.  I have reviewed the patients' chart and labs.  Questions were answered to the patient's satisfaction.     Tanya Marvin,STEVEN R

## 2012-01-07 NOTE — Transfer of Care (Signed)
Immediate Anesthesia Transfer of Care Note  Patient: Jimmy Taylor  Procedure(s) Performed: Procedure(s) (LRB): OPEN REDUCTION INTERNAL FIXATION (ORIF) CLAVICULAR FRACTURE (Right)  Patient Location: PACU  Anesthesia Type: General  Level of Consciousness: awake, alert  and oriented  Airway & Oxygen Therapy: Patient Spontanous Breathing and Patient connected to nasal cannula oxygen  Post-op Assessment: Report given to PACU RN and Post -op Vital signs reviewed and stable  Post vital signs: Reviewed and stable  Complications: No apparent anesthesia complications

## 2012-01-08 ENCOUNTER — Encounter (HOSPITAL_COMMUNITY): Payer: Self-pay | Admitting: Orthopedic Surgery

## 2012-01-08 NOTE — Op Note (Signed)
NAMEMarland Taylor  [name], [name] NO.:  [id number]  MEDICAL RECORD NO.:  [id number]  LOCATION:  [facility]                         FACILITY:  [facility]  PHYSICIAN:  Almedia Balls. Ranell Patrick, M.D. DATE OF BIRTH:  July 31, 1970  DATE OF PROCEDURE:  01/07/2012 DATE OF DISCHARGE:  01/07/2012                              OPERATIVE REPORT   PREOPERATIVE DIAGNOSIS:  Right displaced clavicle fracture.  POSTOPERATIVE DIAGNOSIS:  Right displaced clavicle fracture.  PROCEDURE PERFORMED:  Open reduction internal fixation of right displaced clavicle fracture using DePuy IM clavicle pin.  ATTENDING SURGEON:  Almedia Balls. Ranell Patrick, MD  ASSISTANT:  Donnie Coffin. Dixon, P.A. was scrubbed during entire procedure, necessary for satisfactory completion of surgery.  ANESTHESIA:  General anesthesia was used plus local anesthesia.  ESTIMATED BLOOD LOSS:  Minimal.  FLUID REPLACEMENT:  1000 mL of crystalloid.  INSTRUMENT COUNTS:  Correct.  COMPLICATIONS:  No complications.  ANTIBIOTICS:  Perioperative antibiotics were given.  INDICATIONS:  The patient is a 41 year old male with a history of an injury to his right shoulder.  The patient presented to Orthopedics with a displaced clavicle fracture.  The patient desires operative treatment to restore clavicle length and alignment.  Informed consent was obtained.  The patient desires surgery.  DESCRIPTION OF PROCEDURE:  After adequate level of anesthesia was achieved, the patient was positioned in modified beach-chair position. Right shoulder was correctly identified and was sterilely prepped and draped in usual manner.  Time-out was called.  We then entered the shoulder using standard Langer's line skin incision overlying the fracture site.  Dissection down through the subcutaneous tissues.  The trapezius, platysma muscle, and fascia were identified, divided in line with its fibers.  We identified the fractured clavicle.  We freed up the ends.  We drilled off the  medial clavicle fragment with a drill bit and then the medial tap for the DePuy clavicle pin, this was a 3.0 tap. Next, we went ahead and identified the lateral fragment, freed up from surrounding soft tissues.  There was some granulation tissue around this fracture site, it is subacute, but both pins still fairly fresh.  We drilled out the lateral fragment and then tapped out and then drilled the retrograded of the pin out the lateral fragment, retrieving it through a posterior stab incision.  We then reduced the fracture, introduced the pin across the fracture site.  Gaining anatomic alignment, placed medial and lateral nuts, clipping the machine into the pin, flushed with a nut and then rasping and smooth with the back end of a Cobb elevator, and then we went ahead and introduced it to the final bed into the fracture site.  C-arm was used during the procedure to verify appropriate alignment and position.  Once that was done, then we were able to get final x-rays.  Thorough irrigation of all wounds and layered closure with 0 and 2-0 Vicryl for the skin incision directly overlying the clavicle fracture, and then 4-0 Monocryl for the skin anteriorly and nylon posteriorly.  Sterile compressive bandage was applied.  The patient tolerated the procedure well.  Shoulder sling placed.     Almedia Balls. Ranell Patrick, M.D.     SRN/MEDQ  D:  01/07/2012  T:  01/08/2012  Job:  [id number]

## 2012-01-08 NOTE — Op Note (Signed)
NAMEMarland Kitchen  KACE, HARTJE NO.:  [id number]  MEDICAL RECORD NO.:  [id number]  LOCATION:  [facility]                         FACILITY:  [facility]  PHYSICIAN:  Almedia Balls. Ranell Patrick, M.D. DATE OF BIRTH:  1970-08-09  DATE OF PROCEDURE:  01/07/2012 DATE OF DISCHARGE:  01/07/2012                              OPERATIVE REPORT   PREOPERATIVE DIAGNOSIS:  Right displaced clavicle fracture.  POSTOPERATIVE DIAGNOSIS:  Right displaced clavicle fracture.  PROCEDURE PERFORMED:  Open reduction and internal fixation of displaced right clavicle fracture using DePuy clavicle pin.  ATTENDING SURGEON:  Almedia Balls. Ranell Patrick, MD  ASSISTANT:  Donnie Coffin. Dixon, PA-C, was scrubbed during the entire procedure and necessary for appropriate completion of procedure.  ANESTHESIA:  General anesthesia was used plus local.  ESTIMATED BLOOD LOSS:  Minimal.  FLUID REPLACEMENT:  1000 mL of crystalloid.  INSTRUMENT COUNTS:  Correct.  COMPLICATIONS:  There were no complications.  ANTIBIOTICS:  Perioperative antibiotics were given.  INDICATIONS:  The patient is a 41 year old male who suffered a right displaced clavicle fracture.  The patient presents with shortening and significant pain with the shoulder.  The patient presents now for operative stabilization, alignment of the fracture, stabilization with an IM pin.  Informed consent was obtained.  Risks and benefits of surgical versus nonsurgical management were discussed.  The patient did want to proceed with surgery.  DESCRIPTION OF PROCEDURE:  After adequate level of anesthesia was achieved, the patient was positioned in the modified beach-chair position.  Right shoulder was correctly identified, sterilely prepped and draped in usual manner.  Time-out called.  After sterile prep and drape and time-out, we entered the shoulder through a Langer's line skin incision overlying the fracture site.  Dissection was down through the subcutaneous tissues using  needle-tip Bovie.  Subperiosteal dissection on medial and lateral clavicle fragments were performed.  We identified the fracture site.  I cleaned it up with a rongeur and then drilled out the medial and lateral sides with 3.2 drill bit.  We then went ahead and tapped with a 3.5 clavicle pin tap medially and laterally, this is a DePuy pin.  We retrograded the pin out the lateral clavicle fragment retrieving it through the posterior separate stab incision.  Next, we reduced the fracture and introduced the pin across the fracture site anatomically reducing the fracture.  We then went ahead and placed our medial and lateral nuts.  We cold welded them and I clipped the pin, flushed with the lateral nut, rasped that smooth with a Cobb elevator.  We then went ahead and advanced the pin, flushed with the bone posteriorly.  Once we done that, we went ahead and thoroughly irrigated all wounds, closed with layered Vicryl and Monocryl closure.  Sterile compressive bandage was applied.  The patient tolerated the procedure well.     Almedia Balls. Ranell Patrick, M.D.     SRN/MEDQ  D:  01/07/2012  T:  01/08/2012  Job:  [id number]

## 2012-07-13 HISTORY — PX: BICEPS TENDON REPAIR: SHX566

## 2012-07-13 HISTORY — PX: BACK SURGERY: SHX140

## 2013-04-20 IMAGING — CR DG SHOULDER 2+V*R*
3 series · 3 of 3 positions shown · non-contrast
Comparison: None.

CLINICAL DATA: Pain after trauma

RIGHT SHOULDER - 2+ VIEW

[w shoulder ap internal righ]
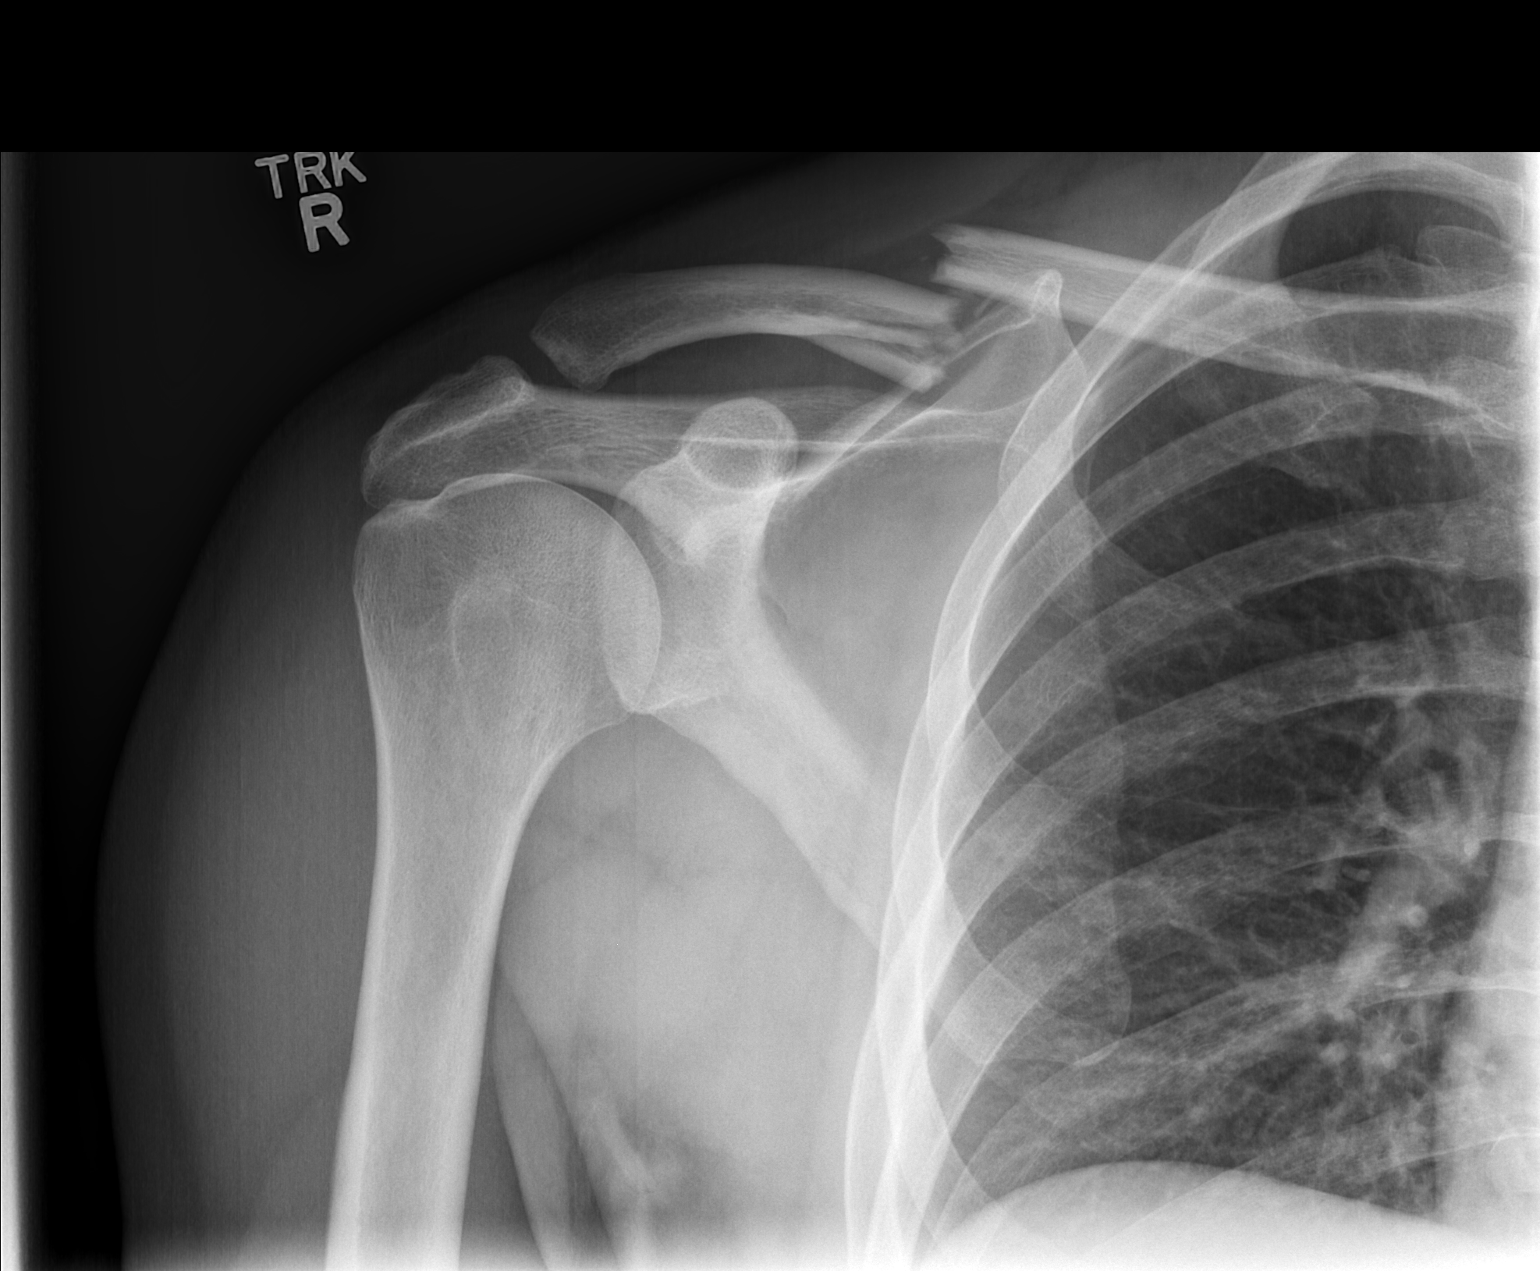

[w shoulder ap external righ]
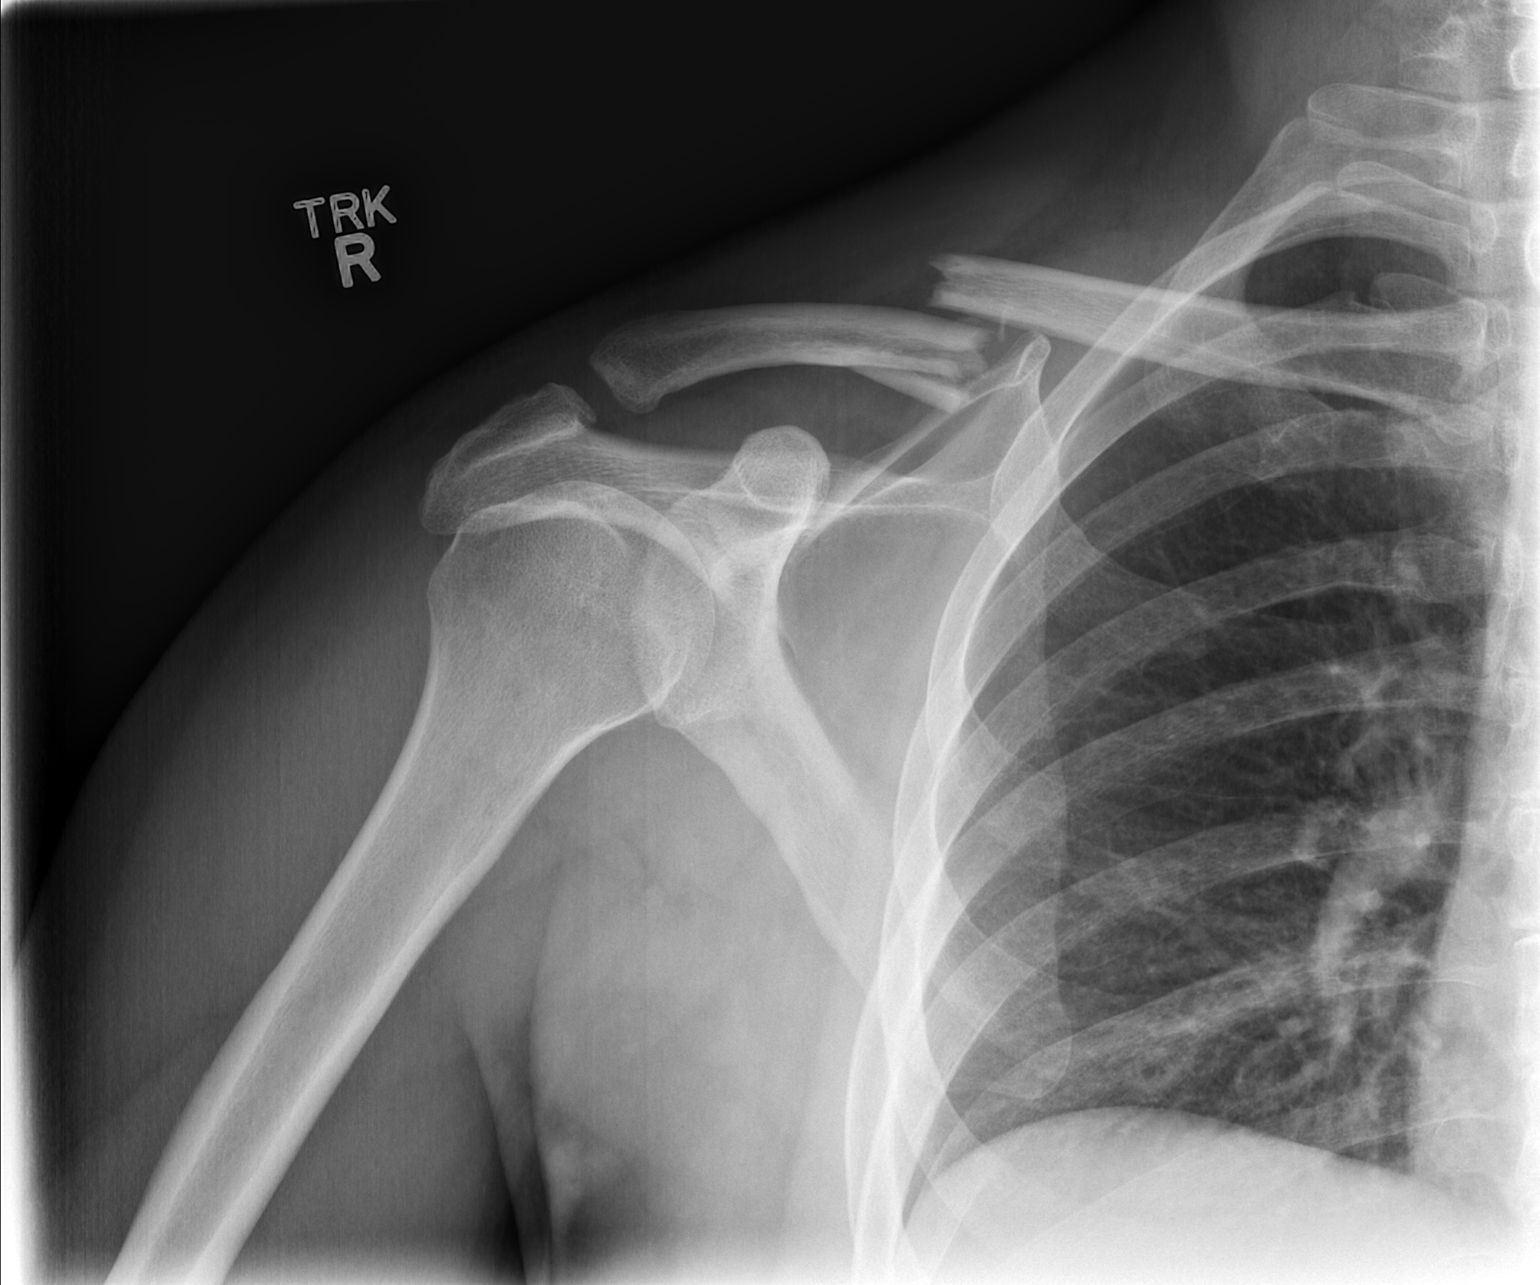

[w shoulder y view right]
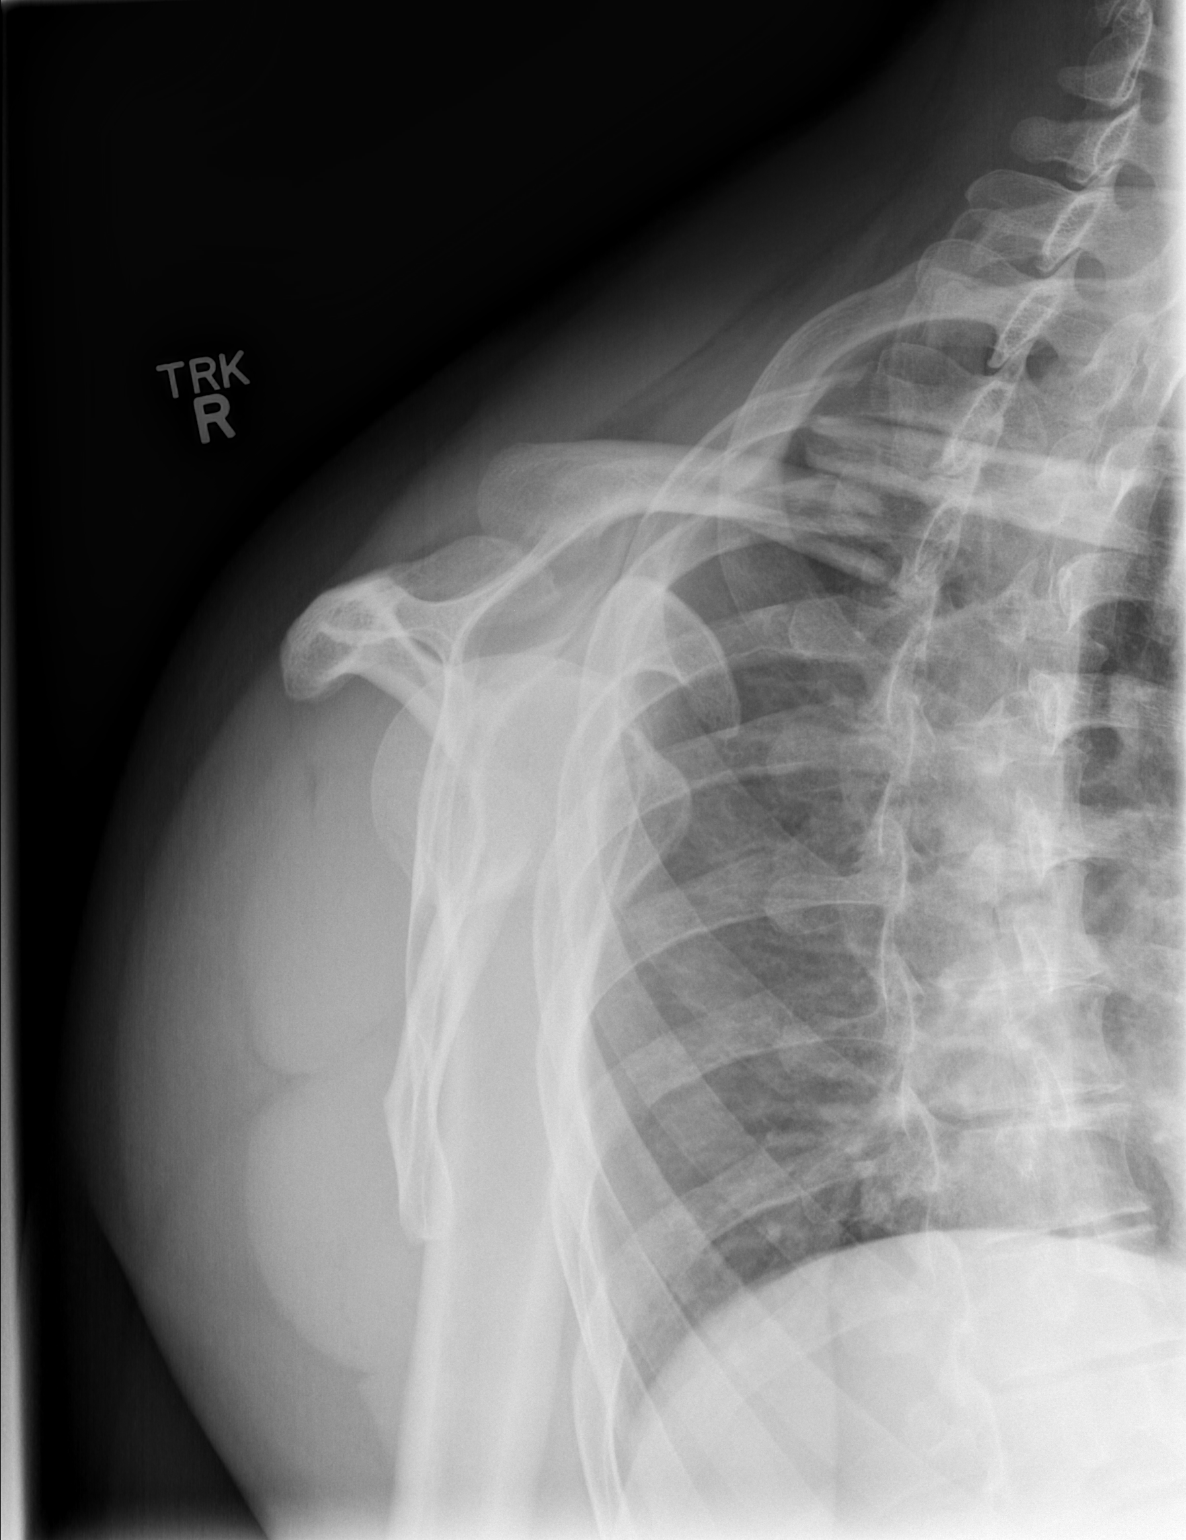

[3 of 3 positions shown; findings below may reference images not displayed]

FINDINGS: There is a transverse fracture at the mid - distal right
clavicle.  The AC joint is widened, measuring 7 mm.  The
subacromial space is maintained.  The humerus and scapula have a
normal appearance.
IMPRESSION: Right clavicular fracture.

AC joint widening.

## 2013-05-20 IMAGING — RF DG CLAVICLE*R*
1 series · 1 of 1 positions shown · non-contrast
Comparison: Preoperative study 12/08/2011.

CLINICAL DATA: 40-year-old male undergoing right clavicle ORIF.

DG C-ARM 1-60 MIN,RIGHT CLAVICLE - 2+ VIEWS
TECHNIQUE: Intraoperative fluoroscopic view the right clavicle.

[Series 1: run · 1 of 1 slices shown]
[im 1/1]
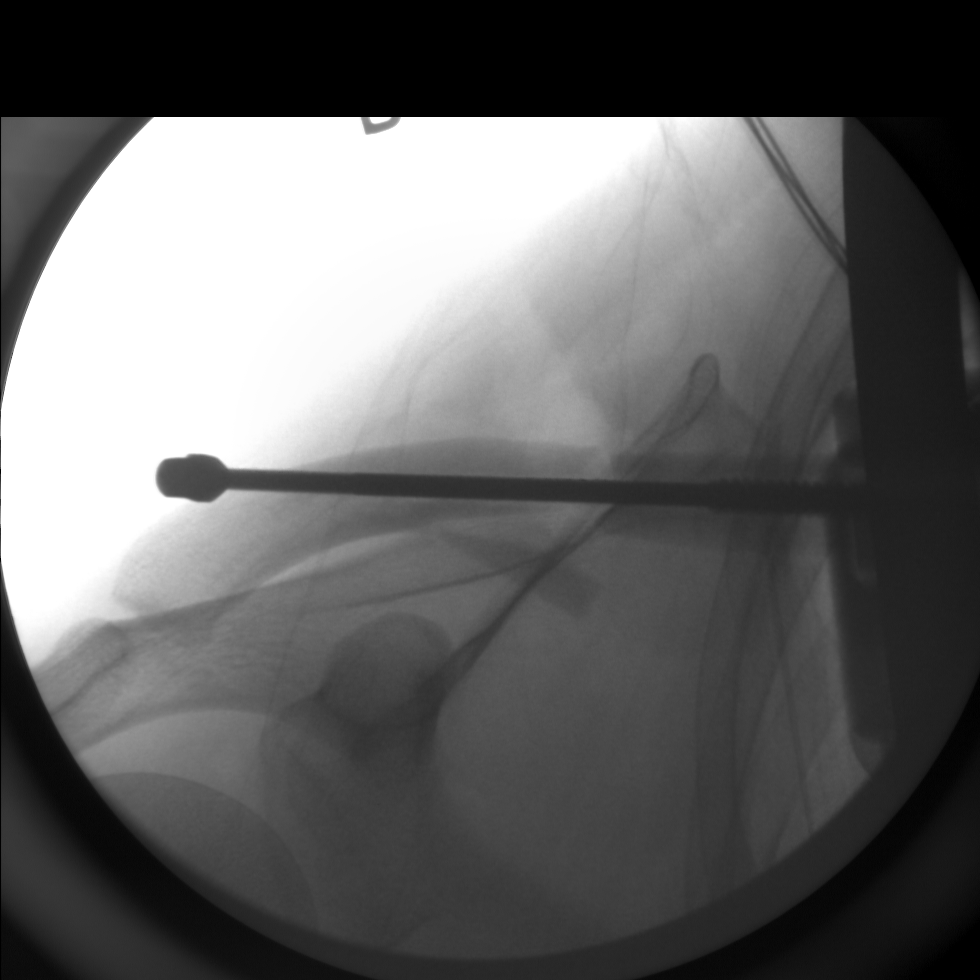

[1 of 1 positions shown; findings below may reference images not displayed]

FINDINGS: Single intraoperative view demonstrating a cannulated
screw or rod placement longitudinally within the right clavicle.
This traverses the comminuted mid shaft fracture.  Alignment is
significantly improved, near anatomic.
IMPRESSION: ORIF of the right clavicle fracture under way.

## 2013-06-02 ENCOUNTER — Other Ambulatory Visit: Payer: Self-pay | Admitting: Neurosurgery

## 2013-06-02 DIAGNOSIS — M5010 Cervical disc disorder with radiculopathy, unspecified cervical region: Secondary | ICD-10-CM

## 2013-06-06 ENCOUNTER — Inpatient Hospital Stay: Admission: RE | Admit: 2013-06-06 | Payer: Worker's Compensation | Source: Ambulatory Visit

## 2013-06-07 ENCOUNTER — Other Ambulatory Visit: Payer: Worker's Compensation

## 2013-06-10 ENCOUNTER — Ambulatory Visit
Admission: RE | Admit: 2013-06-10 | Discharge: 2013-06-10 | Disposition: A | Discharge status: Home / Self Care | Payer: Worker's Compensation | Source: Ambulatory Visit | Attending: Neurosurgery | Admitting: Neurosurgery

## 2013-06-10 ENCOUNTER — Other Ambulatory Visit: Payer: Worker's Compensation

## 2013-06-10 DIAGNOSIS — M5010 Cervical disc disorder with radiculopathy, unspecified cervical region: Secondary | ICD-10-CM

## 2013-07-17 ENCOUNTER — Other Ambulatory Visit: Payer: Self-pay | Admitting: Neurosurgery

## 2013-07-18 ENCOUNTER — Encounter (HOSPITAL_COMMUNITY): Payer: Self-pay | Admitting: Pharmacy Technician

## 2013-07-20 ENCOUNTER — Encounter (HOSPITAL_COMMUNITY)
Admission: RE | Admit: 2013-07-20 | Discharge: 2013-07-20 | Disposition: A | Discharge status: Home / Self Care | Payer: Worker's Compensation | Source: Ambulatory Visit | Attending: Neurosurgery | Admitting: Neurosurgery

## 2013-07-20 ENCOUNTER — Other Ambulatory Visit (HOSPITAL_COMMUNITY): Payer: Self-pay | Admitting: *Deleted

## 2013-07-20 ENCOUNTER — Encounter (HOSPITAL_COMMUNITY): Payer: Self-pay

## 2013-07-20 DIAGNOSIS — Z01818 Encounter for other preprocedural examination: Secondary | ICD-10-CM | POA: Insufficient documentation

## 2013-07-20 DIAGNOSIS — Z01812 Encounter for preprocedural laboratory examination: Secondary | ICD-10-CM | POA: Insufficient documentation

## 2013-07-20 HISTORY — DX: Headache: R51

## 2013-07-20 HISTORY — DX: Personal history of other venous thrombosis and embolism: Z86.718

## 2013-07-20 LAB — SURGICAL PCR SCREEN
MRSA, PCR: NEGATIVE
Staphylococcus aureus: NEGATIVE

## 2013-07-20 LAB — CBC
HCT: 42.4 % (ref 39.0–52.0)
Hemoglobin: 14.9 g/dL (ref 13.0–17.0)
MCH: 32.7 pg (ref 26.0–34.0)
MCHC: 35.1 g/dL (ref 30.0–36.0)
MCV: 93 fL (ref 78.0–100.0)
PLATELETS: 270 10*3/uL (ref 150–400)
RBC: 4.56 MIL/uL (ref 4.22–5.81)
RDW: 12.1 % (ref 11.5–15.5)
WBC: 5.3 10*3/uL (ref 4.0–10.5)

## 2013-07-20 NOTE — Pre-Procedure Instructions (Signed)
Jimmy DemarkCory S Taylor  07/20/2013   Your procedure is scheduled on:  Monday, July 24, 2013 at 12:40 PM.   [facility] at 10:40 AM.   Call this number if you have problems the morning of surgery: [phone]   Remember:   Do not eat food or drink liquids after midnight Sunday, 07/23/13.   Take these medicines the morning of surgery with A SIP OF WATER: Esomeprazole Magnesium (NEXIUM PO), meclizine (ANTIVERT), methocarbamol (ROBAXIN) - if needed, traMADol (ULTRAM) - if needed.     Do not wear jewelry.  Do not wear lotions, powders, or cologne. You may wear deodorant.  Men may shave face and neck.  Do not bring valuables to the hospital.  [facility] is not responsible                  for any belongings or valuables.               Contacts, dentures or bridgework may not be worn into surgery.  Leave suitcase in the car. After surgery it may be brought to your room.  For patients admitted to the hospital, discharge time is determined by your                treatment team.             Special Instructions: Shower using CHG 2 nights before surgery and the night before surgery.  If you shower the day of surgery use CHG.  Use special wash - you have one bottle of CHG for all showers.  You should use approximately 1/3 of the bottle for each shower.   Please read over the following fact sheets that you were given: Pain Booklet, Coughing and Deep Breathing, MRSA Information and Surgical Site Infection Prevention

## 2013-07-24 ENCOUNTER — Encounter (HOSPITAL_COMMUNITY): Admission: RE | Payer: Self-pay | Source: Ambulatory Visit

## 2013-07-24 ENCOUNTER — Ambulatory Visit (HOSPITAL_COMMUNITY): Admission: RE | Admit: 2013-07-24 | Payer: Worker's Compensation | Source: Ambulatory Visit | Admitting: Neurosurgery

## 2013-07-24 SURGERY — ANTERIOR CERVICAL DECOMPRESSION/DISCECTOMY FUSION 1 LEVEL/HARDWARE REMOVAL
Anesthesia: General

## 2015-03-09 ENCOUNTER — Emergency Department (HOSPITAL_COMMUNITY): Payer: No Typology Code available for payment source

## 2015-03-09 ENCOUNTER — Encounter (HOSPITAL_COMMUNITY): Payer: Self-pay | Admitting: *Deleted

## 2015-03-09 ENCOUNTER — Emergency Department (HOSPITAL_COMMUNITY)
Admission: EM | Admit: 2015-03-09 | Discharge: 2015-03-09 | Disposition: A | Discharge status: Home / Self Care | Payer: No Typology Code available for payment source | Attending: Emergency Medicine | Admitting: Emergency Medicine

## 2015-03-09 DIAGNOSIS — Y998 Other external cause status: Secondary | ICD-10-CM | POA: Diagnosis not present

## 2015-03-09 DIAGNOSIS — S199XXA Unspecified injury of neck, initial encounter: Secondary | ICD-10-CM | POA: Insufficient documentation

## 2015-03-09 DIAGNOSIS — Z87891 Personal history of nicotine dependence: Secondary | ICD-10-CM | POA: Insufficient documentation

## 2015-03-09 DIAGNOSIS — Z86718 Personal history of other venous thrombosis and embolism: Secondary | ICD-10-CM | POA: Insufficient documentation

## 2015-03-09 DIAGNOSIS — S0990XA Unspecified injury of head, initial encounter: Secondary | ICD-10-CM | POA: Diagnosis not present

## 2015-03-09 DIAGNOSIS — S3992XA Unspecified injury of lower back, initial encounter: Secondary | ICD-10-CM | POA: Insufficient documentation

## 2015-03-09 DIAGNOSIS — Y92410 Unspecified street and highway as the place of occurrence of the external cause: Secondary | ICD-10-CM | POA: Diagnosis not present

## 2015-03-09 DIAGNOSIS — K219 Gastro-esophageal reflux disease without esophagitis: Secondary | ICD-10-CM | POA: Insufficient documentation

## 2015-03-09 DIAGNOSIS — Y9389 Activity, other specified: Secondary | ICD-10-CM | POA: Insufficient documentation

## 2015-03-09 DIAGNOSIS — S299XXA Unspecified injury of thorax, initial encounter: Secondary | ICD-10-CM | POA: Insufficient documentation

## 2015-03-09 MED ORDER — IBUPROFEN 800 MG PO TABS
800.0000 mg | ORAL_TABLET | Freq: Once | ORAL | Status: AC
  Administered 2015-03-09: 800 mg via ORAL
  Filled 2015-03-09: qty 1

## 2015-03-09 MED ORDER — IBUPROFEN 800 MG PO TABS: 800.0000 mg | ORAL_TABLET | Freq: Three times a day (TID) | ORAL | Status: AC

## 2015-03-09 MED ORDER — METHOCARBAMOL 500 MG PO TABS: 500.0000 mg | ORAL_TABLET | Freq: Four times a day (QID) | ORAL | Status: AC | PRN

## 2015-03-09 NOTE — Discharge Instructions (Signed)
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
